# Patient Record
Sex: Female | Born: 1980 | Race: Black or African American | Hispanic: No | Marital: Single | Smoking: Never smoker
Health system: Southern US, Community
[De-identification: ages and names within clinical notes are randomized; demographics above are authoritative.]

---
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
## 2000-03-07 ENCOUNTER — Encounter: Admission: RE | Admit: 2000-03-07 | Discharge: 2000-03-07 | Payer: Self-pay | Admitting: Sports Medicine

## 2002-12-28 ENCOUNTER — Emergency Department (HOSPITAL_COMMUNITY): Admission: EM | Admit: 2002-12-28 | Discharge: 2002-12-28 | Payer: Self-pay

## 2002-12-31 ENCOUNTER — Emergency Department (HOSPITAL_COMMUNITY): Admission: EM | Admit: 2002-12-31 | Discharge: 2002-12-31 | Payer: Self-pay | Admitting: Emergency Medicine

## 2003-06-12 ENCOUNTER — Encounter: Payer: Self-pay | Admitting: Obstetrics and Gynecology

## 2003-06-12 ENCOUNTER — Ambulatory Visit (HOSPITAL_COMMUNITY): Admission: RE | Admit: 2003-06-12 | Discharge: 2003-06-12 | Payer: Self-pay | Admitting: *Deleted

## 2003-09-29 ENCOUNTER — Inpatient Hospital Stay (HOSPITAL_COMMUNITY): Admission: AD | Admit: 2003-09-29 | Discharge: 2003-09-29 | Payer: Self-pay | Admitting: *Deleted

## 2003-09-30 ENCOUNTER — Inpatient Hospital Stay (HOSPITAL_COMMUNITY): Admission: AD | Admit: 2003-09-30 | Discharge: 2003-09-30 | Payer: Self-pay | Admitting: Obstetrics and Gynecology

## 2003-09-30 ENCOUNTER — Inpatient Hospital Stay (HOSPITAL_COMMUNITY): Admission: AD | Admit: 2003-09-30 | Discharge: 2003-10-02 | Payer: Self-pay | Admitting: Obstetrics and Gynecology

## 2004-08-10 ENCOUNTER — Other Ambulatory Visit: Admission: RE | Admit: 2004-08-10 | Discharge: 2004-08-10 | Payer: Self-pay | Admitting: Obstetrics and Gynecology

## 2004-12-04 ENCOUNTER — Inpatient Hospital Stay (HOSPITAL_COMMUNITY): Admission: AD | Admit: 2004-12-04 | Discharge: 2004-12-06 | Payer: Self-pay | Admitting: Obstetrics and Gynecology

## 2006-01-11 ENCOUNTER — Emergency Department (HOSPITAL_COMMUNITY): Admission: EM | Admit: 2006-01-11 | Discharge: 2006-01-11 | Payer: Self-pay | Admitting: Emergency Medicine

## 2007-08-08 ENCOUNTER — Emergency Department (HOSPITAL_COMMUNITY): Admission: EM | Admit: 2007-08-08 | Discharge: 2007-08-08 | Payer: Self-pay | Admitting: Emergency Medicine

## 2008-12-28 ENCOUNTER — Emergency Department (HOSPITAL_COMMUNITY): Admission: EM | Admit: 2008-12-28 | Discharge: 2008-12-28 | Payer: Self-pay | Admitting: Emergency Medicine

## 2011-05-16 ENCOUNTER — Emergency Department (HOSPITAL_COMMUNITY)
Admission: EM | Admit: 2011-05-16 | Discharge: 2011-05-16 | Disposition: A | Discharge status: Home / Self Care | Payer: Self-pay | Attending: Emergency Medicine | Admitting: Emergency Medicine

## 2011-05-16 ENCOUNTER — Emergency Department (HOSPITAL_COMMUNITY): Payer: Self-pay

## 2011-05-16 DIAGNOSIS — F411 Generalized anxiety disorder: Secondary | ICD-10-CM | POA: Insufficient documentation

## 2011-05-16 DIAGNOSIS — R079 Chest pain, unspecified: Secondary | ICD-10-CM | POA: Insufficient documentation

## 2011-05-16 LAB — URINE MICROSCOPIC-ADD ON

## 2011-05-16 LAB — DIFFERENTIAL
Basophils Relative: 0 % (ref 0–1)
Eosinophils Absolute: 0 10*3/uL (ref 0.0–0.7)
Monocytes Absolute: 0.3 10*3/uL (ref 0.1–1.0)
Monocytes Relative: 5 % (ref 3–12)

## 2011-05-16 LAB — BASIC METABOLIC PANEL WITH GFR
BUN: 6 mg/dL (ref 6–23)
CO2: 21 meq/L (ref 19–32)
Calcium: 9.2 mg/dL (ref 8.4–10.5)
Chloride: 104 meq/L (ref 96–112)
Creatinine, Ser: 0.72 mg/dL (ref 0.50–1.10)
GFR calc Af Amer: >60 mL/min
GFR calc non Af Amer: >60 mL/min
Glucose, Bld: 101 mg/dL — ABNORMAL HIGH (ref 70–99)
Potassium: 3.1 meq/L — ABNORMAL LOW (ref 3.5–5.1)
Sodium: 136 meq/L (ref 135–145)

## 2011-05-16 LAB — URINALYSIS, ROUTINE W REFLEX MICROSCOPIC
Ketones, ur: 15 mg/dL — AB
Protein, ur: NEGATIVE mg/dL
Urobilinogen, UA: 1 mg/dL (ref 0.0–1.0)

## 2011-05-16 LAB — CBC
HCT: 36.6 % (ref 36.0–46.0)
MCV: 89.1 fL (ref 78.0–100.0)
Platelets: 391 10*3/uL (ref 150–400)
RBC: 4.11 MIL/uL (ref 3.87–5.11)
WBC: 6.3 10*3/uL (ref 4.0–10.5)

## 2011-05-16 LAB — CK TOTAL AND CKMB (NOT AT ARMC)
CK, MB: 1.7 ng/mL (ref 0.3–4.0)
Relative Index: 1.3 (ref 0.0–2.5)
Total CK: 131 U/L (ref 7–177)

## 2011-05-16 LAB — TROPONIN I: Troponin I: <0.3 ng/mL

## 2017-03-11 ENCOUNTER — Encounter (HOSPITAL_COMMUNITY): Payer: Self-pay | Admitting: Emergency Medicine

## 2017-03-11 ENCOUNTER — Emergency Department (HOSPITAL_COMMUNITY)
Admission: EM | Admit: 2017-03-11 | Discharge: 2017-03-11 | Disposition: A | Discharge status: Home / Self Care | Payer: BLUE CROSS/BLUE SHIELD | Attending: Emergency Medicine | Admitting: Emergency Medicine

## 2017-03-11 DIAGNOSIS — Y999 Unspecified external cause status: Secondary | ICD-10-CM | POA: Insufficient documentation

## 2017-03-11 DIAGNOSIS — Y9389 Activity, other specified: Secondary | ICD-10-CM | POA: Insufficient documentation

## 2017-03-11 DIAGNOSIS — S0591XA Unspecified injury of right eye and orbit, initial encounter: Secondary | ICD-10-CM | POA: Diagnosis present

## 2017-03-11 DIAGNOSIS — X58XXXA Exposure to other specified factors, initial encounter: Secondary | ICD-10-CM | POA: Diagnosis not present

## 2017-03-11 DIAGNOSIS — S0501XA Injury of conjunctiva and corneal abrasion without foreign body, right eye, initial encounter: Secondary | ICD-10-CM | POA: Diagnosis not present

## 2017-03-11 DIAGNOSIS — Y929 Unspecified place or not applicable: Secondary | ICD-10-CM | POA: Diagnosis not present

## 2017-03-11 MED ORDER — TETRACAINE HCL 0.5 % OP SOLN
2.0000 [drp] | Freq: Once | OPHTHALMIC | Status: AC
  Administered 2017-03-11: 2 [drp] via OPHTHALMIC
  Filled 2017-03-11: qty 4

## 2017-03-11 MED ORDER — FLUORESCEIN SODIUM 0.6 MG OP STRP
1.0000 | ORAL_STRIP | Freq: Once | OPHTHALMIC | Status: AC
  Administered 2017-03-11: 1 via OPHTHALMIC
  Filled 2017-03-11: qty 1

## 2017-03-11 MED ORDER — ERYTHROMYCIN 5 MG/GM OP OINT: 1.0000 "application " | TOPICAL_OINTMENT | Freq: Four times a day (QID) | OPHTHALMIC | 0 refills | Status: AC

## 2017-03-11 NOTE — ED Triage Notes (Signed)
Pt rep[orts pain in r/eye x 1 hour. Pt is concerned that she may have gotten dust or a cleaning chemical in r/eye today. R/eye red and watering. Pt stated that she rinsed with water and eye drops

## 2017-03-11 NOTE — ED Provider Notes (Signed)
[facility]-EMERGENCY DEPT Provider Note   CSN: [id number] Arrival date & time: 03/11/17  1227  By signing my name below, I, Kristina Bradshaw, attest that this documentation has been prepared under the direction and in the presence of [address] [place] Electronically Signed: Deland Bradshaw, ED Scribe. 03/11/17. 2:12 PM.  History   Chief Complaint Chief Complaint  Patient presents with  . Eye Problem   The history is provided by the patient and medical records. No language interpreter was used.  Eye Problem   This is a new problem. The current episode started 1 to 2 hours ago. The problem occurs constantly. The problem has been gradually worsening. There is a problem in the right eye. The injury mechanism was a foreign body. The pain is at a severity of 6/10. The pain is moderate. There is no history of trauma to the eye. There is no known exposure to pink eye. She does not wear contacts. Associated symptoms include blurred vision (in right eye), foreign body sensation and eye redness. Pertinent negatives include no numbness, no discharge, no nausea, no vomiting and no weakness. She has tried eye drops and water for the symptoms. The treatment provided mild relief.    HPI Comments: Kristina Bradshaw is a 36 y.o. female who presents to the Emergency Department complaining of intermittent right eye pain that began two hours ago when a piece of dust came down and went into her eye when she was cleaning. She describes the pain as 6/10 intermittent "FB sensation" irritated nonradiating R eye pain worse with blinking and unrelieved with OTC eye drops and water irrigation. She reports associated redness, tearing, and blurriness to vision in that eye. She denies eye drainage, rhinorrhea, fevers, chills, CP, SOB, abd pain, n/v/d/c, dysuria, hematuria, numbness, tingling, focal weakness, swelling, or any other complaints at this time. She also denies wearing glasses and contacts lenses. No known sick contacts  with pink eye. No recent swimming.   History reviewed. No pertinent past medical history.  There are no active problems to display for this patient.   History reviewed. No pertinent surgical history.  OB History    No data available       Home Medications    Prior to Admission medications   Not on File    Family History History reviewed. No pertinent family history.  Social History Social History  Substance Use Topics  . Smoking status: Never Smoker  . Smokeless tobacco: Never Used  . Alcohol use No     Allergies   Patient has no known allergies.   Review of Systems Review of Systems  Constitutional: Negative for chills and fever.  HENT: Negative for rhinorrhea.   Eyes: Positive for blurred vision (in right eye), pain, redness and visual disturbance. Negative for discharge.  Respiratory: Negative for shortness of breath.   Cardiovascular: Negative for chest pain.  Gastrointestinal: Negative for abdominal pain, constipation, diarrhea, nausea and vomiting.  Genitourinary: Negative for dysuria and hematuria.  Musculoskeletal: Negative for arthralgias and myalgias.  Skin: Negative for color change.  Allergic/Immunologic: Negative for immunocompromised state.  Neurological: Negative for weakness and numbness.  Psychiatric/Behavioral: Negative for decreased concentration.  All other systems reviewed and are negative for acute change except as noted in the HPI.     Physical Exam Updated Vital Signs BP (!) 143/96 (BP Location: Right Arm)   Pulse 81   Temp 98.5 F (36.9 C) (Oral)   Resp 14   Ht 5' (1.524 m)   Wt  180 lb (81.6 kg)   SpO2 100%   BMI 35.15 kg/m   Physical Exam  Constitutional: She is oriented to person, place, and time. Vital signs are normal. She appears well-developed and well-nourished.  Non-toxic appearance. No distress.  Afebrile, nontoxic, NAD  HENT:  Head: Normocephalic and atraumatic.  Mouth/Throat: Mucous membranes are normal.    Eyes: EOM are normal. Pupils are equal, round, and reactive to light. Lids are everted and swept, no foreign bodies found. Right eye exhibits no discharge. No foreign body present in the right eye. Left eye exhibits no discharge. No foreign body present in the left eye. Right conjunctiva is injected. Right conjunctiva has no hemorrhage.  Slit lamp exam:      The right eye shows corneal abrasion and fluorescein uptake. The right eye shows no corneal flare, no foreign body, no hyphema and no hypopyon.  PERRL, EOMI, no nystagmus, right eye with mild conjunctival injection but no hemorrhage, no visualized FBs after lids everted and swept, Fluorescein stain of right eye demonstrates circular corneal abrasion directly over the pupil. No ocular drainage, no consensual eye pain or pain with EOM.   Visual Acuity Right Eye Distance: 20/70 Left Eye Distance: 20/25 Bilateral Distance: 20/25  Neck: Normal range of motion. Neck supple.  Cardiovascular: Normal rate and intact distal pulses.   Pulmonary/Chest: Effort normal. No respiratory distress.  Abdominal: Normal appearance. She exhibits no distension.  Musculoskeletal: Normal range of motion.  Neurological: She is alert and oriented to person, place, and time. She has normal strength. No sensory deficit.  Skin: Skin is warm, dry and intact. No rash noted.  Psychiatric: She has a normal mood and affect. Her behavior is normal.  Nursing note and vitals reviewed.    ED Treatments / Results   DIAGNOSTIC STUDIES: Oxygen Saturation is 100% on RA, normal by my interpretation.   COORDINATION OF CARE: 1:58 PM-Discussed next steps with pt tylenol for pain. Pt verbalized understanding and is agreeable with the plan.   Labs (all labs ordered are listed, but only abnormal results are displayed) Labs Reviewed - No data to display  EKG  EKG Interpretation None       Radiology No results found.  Procedures Procedures (including critical care  time)  Medications Ordered in ED Medications  tetracaine (PONTOCAINE) 0.5 % ophthalmic solution 2 drop (2 drops Right Eye Given 03/11/17 1418)  fluorescein ophthalmic strip 1 strip (1 strip Right Eye Given 03/11/17 1418)     Initial Impression / Assessment and Plan / ED Course  I have reviewed the triage vital signs and the nursing notes.  Pertinent labs & imaging results that were available during my care of the patient were reviewed by me and considered in my medical decision making (see chart for details).     36 y.o. female here with R eye pain after a piece of dust flew into it while she was cleaning. On exam, mildly injected conjunctiva, mild tearing but no ocular drainage, fluroescein exam reveals abrasion of cornea over the pupil. No FBs noted in the eye. Will rx erythromycin ointment, advised OTC remedies for pain relief. F/up with ophthalmology in 3-4 days for recheck. I explained the diagnosis and have given explicit precautions to return to the ER including for any other new or worsening symptoms. The patient understands and accepts the medical plan as it's been dictated and I have answered their questions. Discharge instructions concerning home care and prescriptions have been given. The patient is STABLE and  is discharged to home in good condition.   I personally performed the services described in this documentation, which was scribed in my presence. The recorded information has been reviewed and is accurate.    Final Clinical Impressions(s) / ED Diagnoses   Final diagnoses:  Abrasion of right cornea, initial encounter    New Prescriptions New Prescriptions   ERYTHROMYCIN OPHTHALMIC OINTMENT    Place 1 application into the right eye 4 (four) times daily. Apply thin 1/2 inch ribbon to lower eyelid every 6 hours x 7 days     [address] [place], [place] 03/11/17 1430    Bethann Berkshire, MD 03/15/17 1256

## 2017-03-11 NOTE — Discharge Instructions (Signed)
Try to avoid rubbing eyes. Apply warm or cool compresses and use prescribed eye ointments as directed. Alternate between tylenol and motrin as needed for pain. Follow up with the ophthalmologist in 3-4 days for recheck of your corneal abrasion. Return to the ER for changes or worsening symptoms.

## 2017-06-13 ENCOUNTER — Emergency Department (HOSPITAL_COMMUNITY): Payer: BLUE CROSS/BLUE SHIELD

## 2017-06-13 ENCOUNTER — Encounter (HOSPITAL_COMMUNITY): Payer: Self-pay

## 2017-06-13 ENCOUNTER — Emergency Department (HOSPITAL_COMMUNITY)
Admission: EM | Admit: 2017-06-13 | Discharge: 2017-06-13 | Disposition: A | Discharge status: Home / Self Care | Payer: BLUE CROSS/BLUE SHIELD | Attending: Emergency Medicine | Admitting: Emergency Medicine

## 2017-06-13 DIAGNOSIS — J069 Acute upper respiratory infection, unspecified: Secondary | ICD-10-CM | POA: Insufficient documentation

## 2017-06-13 DIAGNOSIS — R05 Cough: Secondary | ICD-10-CM | POA: Diagnosis present

## 2017-06-13 NOTE — ED Triage Notes (Signed)
Patient states she thought she had a virus over the weekend and she isn't feeling any better. Patient states she is feeling hot and cold and when "I breathe it don't feel like normal".

## 2017-06-13 NOTE — ED Provider Notes (Signed)
[facility]-EMERGENCY DEPT Provider Note   CSN: [id number] Arrival date & time: 06/13/17  0425     History   Chief Complaint Chief Complaint  Patient presents with  . Viral symptoms    HPI Kristina Bradshaw is a 36 y.o. female.  The history is provided by the patient. No language interpreter was used.  Cough  This is a new problem. The current episode started more than 2 days ago. The problem occurs constantly. The cough is non-productive. There has been no fever. Associated symptoms include chills. She has tried nothing for the symptoms. The treatment provided no relief. She is not a smoker. Her past medical history does not include pneumonia or asthma.  Pt reports she has tightness when she breaths.    History reviewed. No pertinent past medical history.  There are no active problems to display for this patient.   History reviewed. No pertinent surgical history.  OB History    No data available       Home Medications    Prior to Admission medications   Medication Sig Start Date End Date Taking? Authorizing Provider  guaifenesin (ROBITUSSIN) 100 MG/5ML syrup Take 200 mg by mouth 3 (three) times daily as needed for cough.   Yes [provider]  Multiple Vitamin (MULTIVITAMIN WITH MINERALS) TABS tablet Take 2 tablets by mouth daily.   Yes [provider]    Family History No family history on file.  Social History Social History  Substance Use Topics  . Smoking status: Never Smoker  . Smokeless tobacco: Never Used  . Alcohol use No     Allergies   Patient has no known allergies.   Review of Systems Review of Systems  Constitutional: Positive for chills.  Respiratory: Positive for cough.   All other systems reviewed and are negative.    Physical Exam Updated Vital Signs BP 123/74 (BP Location: Left Arm)   Pulse 80   Temp 98.3 F (36.8 C) (Oral)   Resp 16   Ht 5' (1.524 m)   Wt 87.1 kg (192 lb)   SpO2 100%   BMI 37.50 kg/m    Physical Exam  Constitutional: She appears well-developed and well-nourished. No distress.  HENT:  Head: Normocephalic and atraumatic.  Right Ear: External ear normal.  Left Ear: External ear normal.  Nose: Nose normal.  Mouth/Throat: Oropharynx is clear and moist.  Eyes: Conjunctivae are normal.  Neck: Neck supple.  Cardiovascular: Normal rate and regular rhythm.   No murmur heard. Pulmonary/Chest: Effort normal and breath sounds normal. No respiratory distress.  Abdominal: Soft. There is no tenderness.  Musculoskeletal: She exhibits no edema.  Neurological: She is alert.  Skin: Skin is warm and dry.  Psychiatric: She has a normal mood and affect.  Nursing note and vitals reviewed.    ED Treatments / Results  Labs (all labs ordered are listed, but only abnormal results are displayed) Labs Reviewed  POC URINE PREG, ED    EKG  EKG Interpretation None       Radiology Dg Chest 2 View  Result Date: 06/13/2017 CLINICAL DATA:  Upper chest tightness.  Duration 2 days. EXAM: CHEST  2 VIEW COMPARISON:  05/16/2011 FINDINGS: The lungs are clear. The pulmonary vasculature is normal. Heart size is normal. Hilar and mediastinal contours are unremarkable. There is no pleural effusion. IMPRESSION: No active cardiopulmonary disease. Electronically Signed   By: Ellery Plunk M.D.   On: 06/13/2017 05:46    Procedures Procedures (including critical care  time)  Medications Ordered in ED Medications - No data to display   Initial Impression / Assessment and Plan / ED Course  I have reviewed the triage vital signs and the nursing notes.  Pertinent labs & imaging results that were available during my care of the patient were reviewed by me and considered in my medical decision making (see chart for details).     I suspect viral illness.  I advised pt tylenol, cough drops, supportive care.  Pt advised to follow up with primary care if symptoms persist or worsen. An After Visit  Summary was printed and given to the patient.   Final Clinical Impressions(s) / ED Diagnoses   Final diagnoses:  Viral upper respiratory tract infection    New Prescriptions New Prescriptions   No medications on file     Osie Cheeks 06/13/17 0802    Raeford Razor, MD 06/13/17 1640

## 2017-06-13 NOTE — Discharge Instructions (Signed)
Return if any problems.

## 2017-06-17 ENCOUNTER — Emergency Department (HOSPITAL_COMMUNITY)
Admission: EM | Admit: 2017-06-17 | Discharge: 2017-06-17 | Disposition: A | Discharge status: Home / Self Care | Payer: BLUE CROSS/BLUE SHIELD | Attending: Emergency Medicine | Admitting: Emergency Medicine

## 2017-06-17 ENCOUNTER — Encounter (HOSPITAL_COMMUNITY): Payer: Self-pay | Admitting: Emergency Medicine

## 2017-06-17 DIAGNOSIS — K219 Gastro-esophageal reflux disease without esophagitis: Secondary | ICD-10-CM | POA: Insufficient documentation

## 2017-06-17 DIAGNOSIS — R12 Heartburn: Secondary | ICD-10-CM | POA: Diagnosis present

## 2017-06-17 MED ORDER — OMEPRAZOLE 20 MG PO CPDR: 20.0000 mg | DELAYED_RELEASE_CAPSULE | Freq: Every day | ORAL | 0 refills | Status: AC

## 2017-06-17 MED ORDER — GI COCKTAIL ~~LOC~~
30.0000 mL | Freq: Once | ORAL | Status: AC
  Administered 2017-06-17: 30 mL via ORAL
  Filled 2017-06-17: qty 30

## 2017-06-17 NOTE — ED Notes (Signed)
Bed: [id number] Expected date:  Expected time:  Means of arrival:  Comments: EMS/chest burning s/p URI

## 2017-06-17 NOTE — ED Provider Notes (Signed)
[facility]-EMERGENCY DEPT Provider Note   CSN: [id number] [id number] date & time: 06/17/17  0420     History   Chief Complaint Chief Complaint  Patient presents with  . burning sensation on chest    HPI Kristina Bradshaw is a 36 y.o. female.  Patient presents to the ED with a chief complaint of burning sensation in chest.  She states that she has had these symptoms intermittently for a while, but that they worsened about 5 days ago.  She states that she was seen recently and diagnosed with URI.  She states that she hasn't really had too much URI symptoms.  She denies any fevers, chills, rhinorrhea, or cough.  She states that it feels like she needs to belch.  She feels worse when she lies down.  She eats a lot of spicy food.  She denies chest pain, SOB, or radiating pain.  She denies any history of heart disease.   The history is provided by the patient. No language interpreter was used.    History reviewed. No pertinent past medical history.  There are no active problems to display for this patient.   History reviewed. No pertinent surgical history.  OB History    No data available       Home Medications    Prior to Admission medications   Medication Sig Start Date End Date Taking? Authorizing Provider  guaifenesin (ROBITUSSIN) 100 MG/5ML syrup Take 200 mg by mouth 3 (three) times daily as needed for cough.    [provider]  Multiple Vitamin (MULTIVITAMIN WITH MINERALS) TABS tablet Take 2 tablets by mouth daily.    [provider]    Family History History reviewed. No pertinent family history.  Social History Social History  Substance Use Topics  . Smoking status: Never Smoker  . Smokeless tobacco: Never Used  . Alcohol use No     Allergies   Patient has no known allergies.   Review of Systems Review of Systems  All other systems reviewed and are negative.    Physical Exam Updated Vital Signs BP (!) 149/84 (BP Location: Left Arm)   Pulse  83   Temp 98.2 F (36.8 C) (Oral)   Resp 18   SpO2 98%   Physical Exam  Constitutional: She is oriented to person, place, and time. She appears well-developed and well-nourished.  HENT:  Head: Normocephalic and atraumatic.  Eyes: Pupils are equal, round, and reactive to light. Conjunctivae and EOM are normal.  Neck: Normal range of motion. Neck supple.  Cardiovascular: Normal rate and regular rhythm.  Exam reveals no gallop and no friction rub.   No murmur heard. Pulmonary/Chest: Effort normal and breath sounds normal. No respiratory distress. She has no wheezes. She has no rales. She exhibits no tenderness.  Abdominal: Soft. Bowel sounds are normal. She exhibits no distension and no mass. There is no tenderness. There is no rebound and no guarding.  Musculoskeletal: Normal range of motion. She exhibits no edema or tenderness.  Neurological: She is alert and oriented to person, place, and time.  Skin: Skin is warm and dry.  Psychiatric: She has a normal mood and affect. Her behavior is normal. Judgment and thought content normal.  Nursing note and vitals reviewed.    ED Treatments / Results  Labs (all labs ordered are listed, but only abnormal results are displayed) Labs Reviewed - No data to display  EKG  EKG Interpretation None       Radiology No results found.  Procedures Procedures (including critical care time)  Medications Ordered in ED Medications  gi cocktail (Maalox,Lidocaine,Donnatal) (not administered)     Initial Impression / Assessment and Plan / ED Course  I have reviewed the triage vital signs and the nursing notes.  Pertinent labs & imaging results that were available during my care of the patient were reviewed by me and considered in my medical decision making (see chart for details).     Patient with burning in chest.  Worse lying down.  Intermittent for quite a while, but recently worsened over the past few days.  Feels like she needs to  belch.  She is low risk for ACS. CXR reviewed from visit 2 days ago was normal. I am much more suspicious of GERD.  Will give GI cocktail.  Plan to treat with omeprazole.  Final Clinical Impressions(s) / ED Diagnoses   Final diagnoses:  Gastroesophageal reflux disease, esophagitis presence not specified    New Prescriptions New Prescriptions   OMEPRAZOLE (PRILOSEC) 20 MG CAPSULE    Take 1 capsule (20 mg total) by mouth daily.     Roxy Horseman, PA-C 06/17/17 1610    Paula Libra, MD 06/17/17 0700

## 2017-06-17 NOTE — ED Triage Notes (Signed)
Per EMS, pt. From home with complaint of " burning sensation on chest " x 5 days. Pt. Was seen here two days ago with the same problem and was told she had respiratory infection but was not on any medications. Pt.stated " I felt like burping but I can't , just like  dry hives"  . Denied coughing , denied fever , denied N/V.  No SOB.

## 2018-04-10 IMAGING — CR DG CHEST 2V
2 series · 2 of 2 positions shown · non-contrast
Comparison: 05/16/2011

CLINICAL DATA: Upper chest tightness.  Duration 2 days.

EXAM:
CHEST  2 VIEW

[w chest pa]
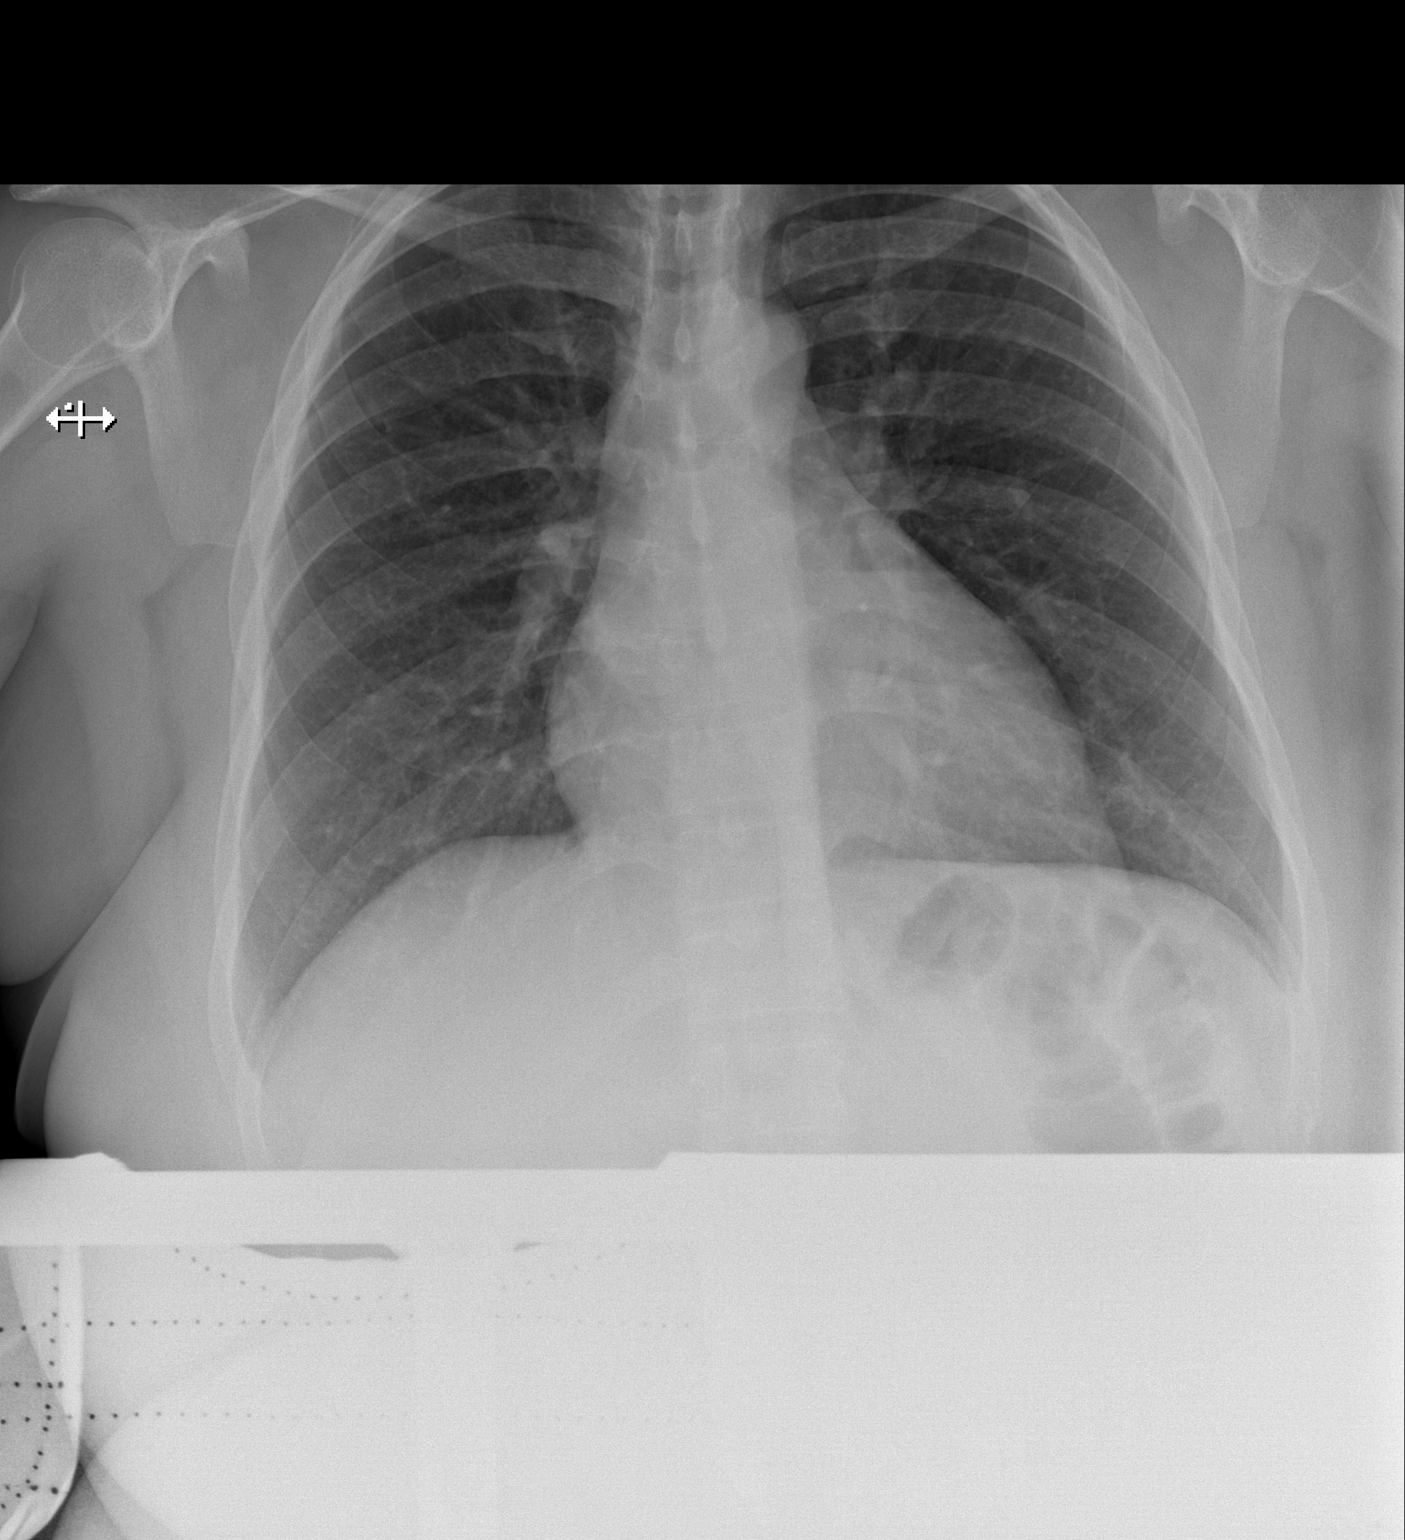

[w chest lat]
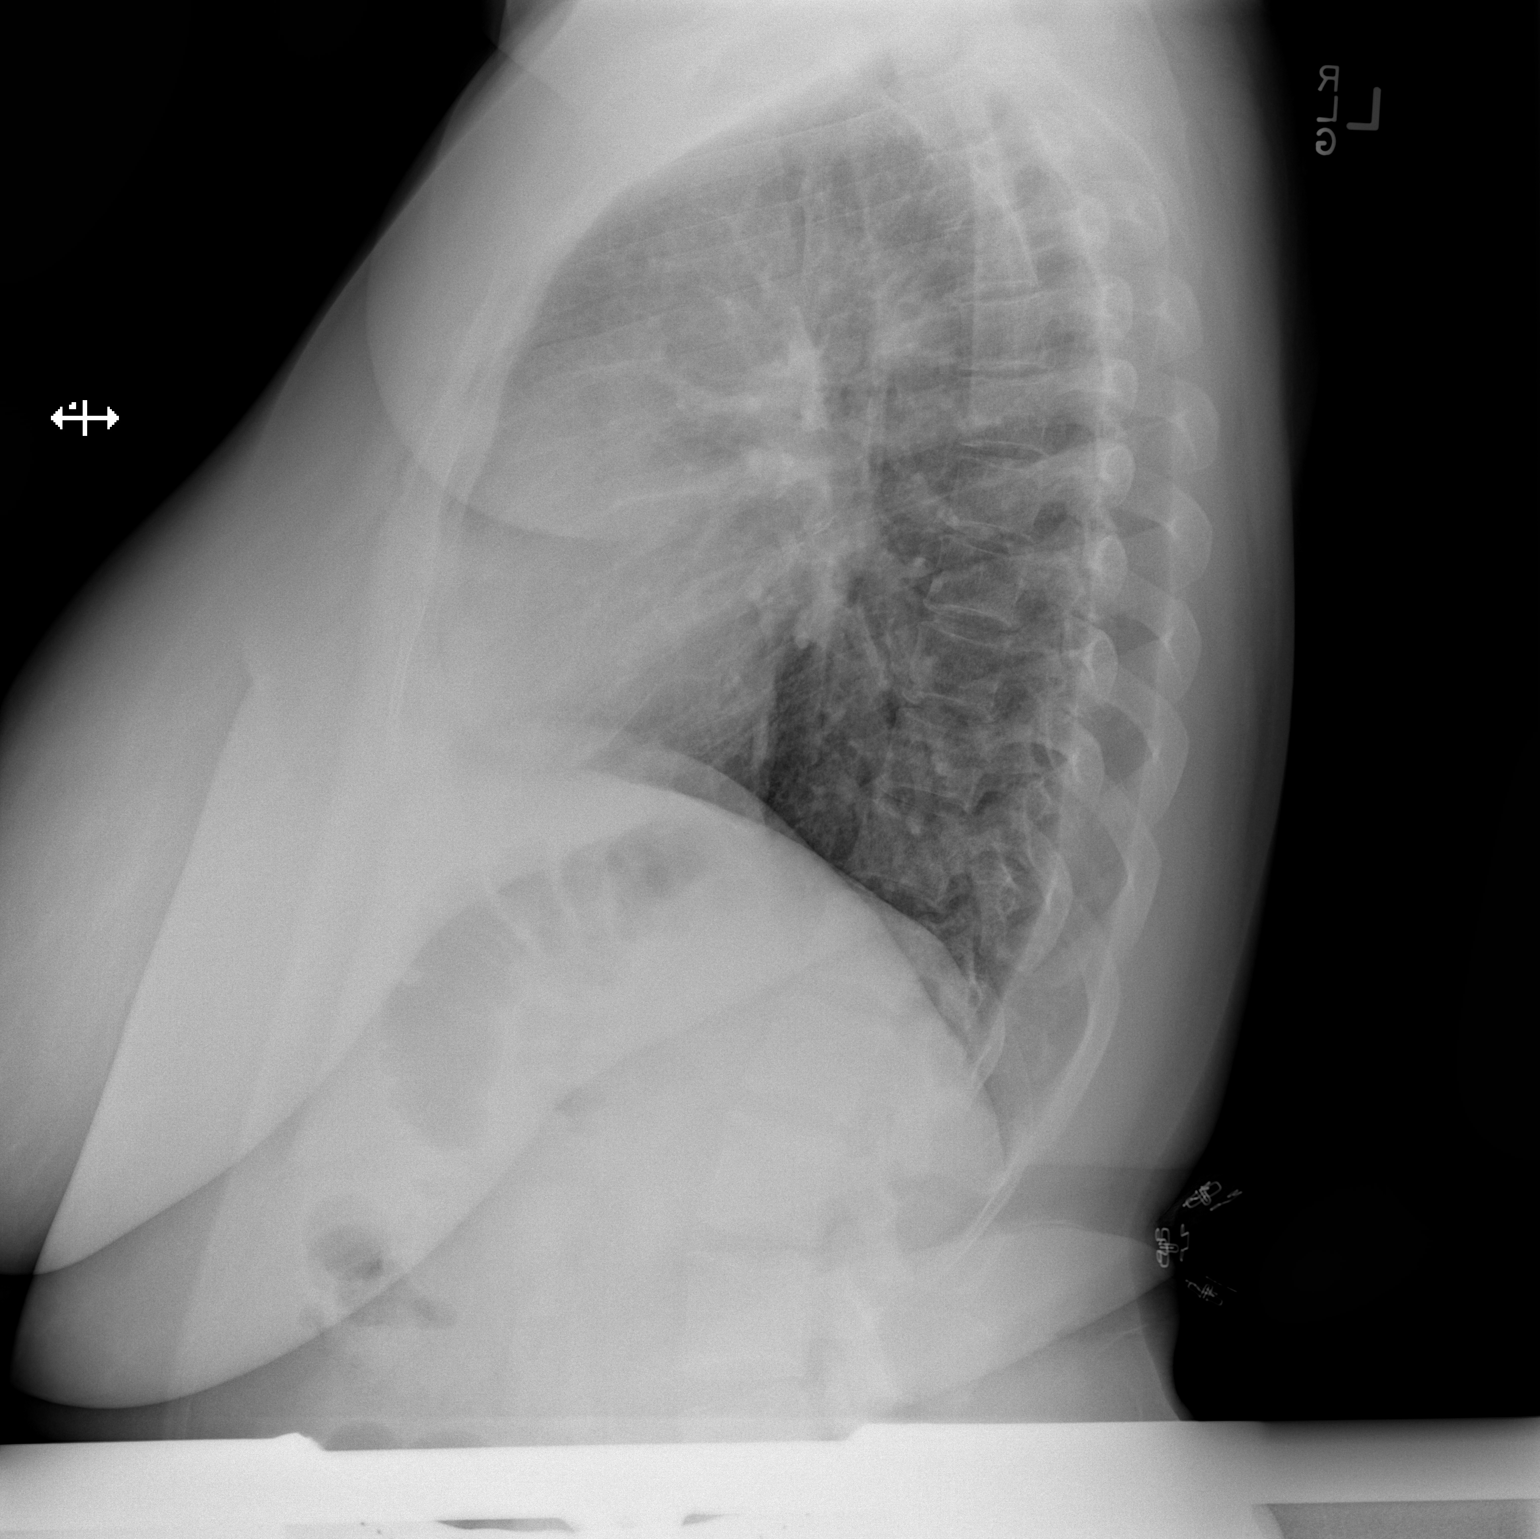

[2 of 2 positions shown; findings below may reference images not displayed]

FINDINGS: The lungs are clear. The pulmonary vasculature is normal. Heart size
is normal. Hilar and mediastinal contours are unremarkable. There is
no pleural effusion.
IMPRESSION: No active cardiopulmonary disease.

## 2022-09-20 ENCOUNTER — Emergency Department (HOSPITAL_COMMUNITY)
Admission: EM | Admit: 2022-09-20 | Discharge: 2022-09-20 | Disposition: A | Discharge status: Home / Self Care | Payer: Commercial Managed Care - HMO | Attending: Emergency Medicine | Admitting: Emergency Medicine

## 2022-09-20 ENCOUNTER — Other Ambulatory Visit: Payer: Self-pay

## 2022-09-20 ENCOUNTER — Encounter (HOSPITAL_COMMUNITY): Payer: Self-pay

## 2022-09-20 DIAGNOSIS — H109 Unspecified conjunctivitis: Secondary | ICD-10-CM | POA: Diagnosis not present

## 2022-09-20 DIAGNOSIS — H5789 Other specified disorders of eye and adnexa: Secondary | ICD-10-CM | POA: Diagnosis present

## 2022-09-20 MED ORDER — POLYMYXIN B-TRIMETHOPRIM 10000-0.1 UNIT/ML-% OP SOLN: 1.0000 [drp] | OPHTHALMIC | 0 refills | Status: AC

## 2022-09-20 NOTE — ED Provider Notes (Signed)
[facility] EMERGENCY DEPARTMENT Provider Note   CSN: [id number] Arrival date & time: 09/20/22  0254     History  Chief Complaint  Patient presents with   Eye Drainage    Kristina Bradshaw is a 41 y.o. female.  The history is provided by the patient and medical records.   41 y.o. F here with eye redness and drainage for the past week.  States it started in left eye and now transitied to right eye.  Initially thought related to allergies but continues worsening.  She reports when she first opens her eyes it is like a "film" over her eye.  Continued drainage and tearing throughout the day.  No fever.  No sick contacts with similar.  Denies vision changes.  Does not wear glasses or corrective lenses.  Denies any trauma, chemical or FB exposure to the eye.  Home Medications Prior to Admission medications   Medication Sig Start Date End Date Taking? Authorizing Provider  guaifenesin (ROBITUSSIN) 100 MG/5ML syrup Take 200 mg by mouth 3 (three) times daily as needed for cough.    [provider]  Multiple Vitamin (MULTIVITAMIN WITH MINERALS) TABS tablet Take 2 tablets by mouth daily.    [provider]  omeprazole (PRILOSEC) 20 MG capsule Take 1 capsule (20 mg total) by mouth daily. 06/17/17   Roxy Horseman, PA-C      Allergies    Patient has no known allergies.    Review of Systems   Review of Systems  Eyes:  Positive for pain and redness.  All other systems reviewed and are negative.   Physical Exam Updated Vital Signs BP (!) 164/103 (BP Location: Right Arm)   Pulse 100   Temp 98.1 F (36.7 C) (Oral)   Resp 18   Ht 5' (1.524 m)   Wt 84.8 kg   SpO2 100%   BMI 36.52 kg/m   Physical Exam Vitals and nursing note reviewed.  Constitutional:      Appearance: She is well-developed.  HENT:     Head: Normocephalic and atraumatic.  Eyes:     Conjunctiva/sclera: Conjunctivae normal.     Pupils: Pupils are equal, round, and reactive to  light.     Comments: No lid edema or erythema, left conjunctiva diffusely injected and purulent drainage in the medial canthus, left conjunctiva injected medially and tearing, EOMs intact and non-painful, no FB noted  Cardiovascular:     Rate and Rhythm: Normal rate and regular rhythm.     Heart sounds: Normal heart sounds.  Pulmonary:     Effort: Pulmonary effort is normal.     Breath sounds: Normal breath sounds.  Abdominal:     General: Bowel sounds are normal.     Palpations: Abdomen is soft.  Musculoskeletal:        General: Normal range of motion.     Cervical back: Normal range of motion.  Skin:    General: Skin is warm and dry.  Neurological:     Mental Status: She is alert and oriented to person, place, and time.     ED Results / Procedures / Treatments   Labs (all labs ordered are listed, but only abnormal results are displayed) Labs Reviewed - No data to display  EKG None  Radiology No results found.  Procedures Procedures    Medications Ordered in ED Medications - No data to display  ED Course/ Medical Decision Making/ A&P  Medical Decision Making Risk Prescription drug management.   41 y.o. F here with bilateral eye redness and drainage, right worse than left.  No FB exposure, no trauma.  Does have some purulent drainage in medial canthi and eyes are tearing.  EOMs intact and non-painful.  PERRL.  Does not wear glasses or corrective lenses.  No signs/symptoms concerning for orbital/pre-septal cellulitis.  Suspect she likely has conjunctivitis.  Will start on polytrim drops.  Given information for ophthalmology on call.  Can return here for new concerns.  Final Clinical Impression(s) / ED Diagnoses Final diagnoses:  Conjunctivitis of both eyes, unspecified conjunctivitis type    Rx / DC Orders ED Discharge Orders          Ordered    trimethoprim-polymyxin b (POLYTRIM) ophthalmic solution  Every 4 hours        09/20/22  0326              Garlon Hatchet, PA-C 09/20/22 5638    Sabas Sous, MD 09/20/22 [phone]

## 2022-09-20 NOTE — ED Triage Notes (Signed)
Pt arrived POV from home c/o right eye pain, redness and drainage x1 week. Pt states she thought it was allergies but now she is thinking it is pink eye.

## 2022-09-20 NOTE — Discharge Instructions (Signed)
Take the prescribed medication as directed.  Try to keep eyes cleansed as much as possible. Follow-up with eye doctor if ongoing issues. Return to the ED for new or worsening symptoms.
